# Patient Record
Sex: Female | Born: 1975 | Race: Asian | Hispanic: No | Marital: Married | Smoking: Never smoker
Health system: Southern US, Community
[De-identification: ages and names within clinical notes are randomized; demographics above are authoritative.]

## PROBLEM LIST (undated history)

## (undated) DIAGNOSIS — M419 Scoliosis, unspecified: Secondary | ICD-10-CM

## (undated) DIAGNOSIS — J302 Other seasonal allergic rhinitis: Secondary | ICD-10-CM

## (undated) DIAGNOSIS — T782XXA Anaphylactic shock, unspecified, initial encounter: Secondary | ICD-10-CM

## (undated) HISTORY — PX: NO PAST SURGERIES: SHX2092

---

## 2012-10-29 NOTE — H&P (Signed)
  History of Present Illness The patient is a 37 year old female who presents today for follow up of their neck. The patient is being followed for their neck pain. The patient presents today following MRI.  Subjective Transcription  She returns today for follow up. She has had an opportunity to see Dr. Yevette Edwards and she was pleased with that second opinion. We have also had a chance to review her new MRI. Today's MRI compared to September 27 still shows the ongoing C6-7 hard disc osteophyte causing compression and foraminal stenosis on the right side. There is Modic endplate changes with a new Schmorl's node extending into the C6 vertebral body. There is severe foraminal stenosis with compression of the right C7 nerve root.    Problem List/Past Medical Pain, Cervical (723.1)   Allergies No Known Drug Allergies. 02/23/2012   Social History Tobacco use. never smoker; smoke(d) less than 1/2 pack(s) per day; uses less than half 1/2 can(s) smokeless per week   Medication History BC pill Active. anti-histamine Active. (prn)   Objective Transcription  She is a pleasant woman who appears younger than her stated age. She is alert and oriented times three. No shortness of breath or chest pain. Abdomen is soft, nontender. No history of incontinence of bowel or bladder. Normal gait pattern. Negative Babinski test. 1+ symmetrical deep tendon reflexes. She is still having significant pain into the right scapula and occasionally numbness and dysesthesias into the right C7 distribution. Her right triceps strength is now 4/5 which has progressed from her September visit. No obvious skin lesions, abrasions, contusions to the cervical spine.    Plans Transcription  At this time I am quite concerned because of the ongoing nature of her radiculopathy. There are motor deficits which seem to be subjectively worse than compared to when she first saw me in August. She has had an  opportunity to review all of the treatment options. We have tried physical therapy, injection therapy, medications and yet she continues to have worsening overall clinical symptoms. At this point I think the best course of action is a single level anterior cervical discectomy and fusion. This would allow decompression of the nerve root, removal of the fragment, stabilization of the joint so that the bone spur can reabsorb. The risks include infection, bleeding, nerve damage, death, stroke, paralysis, failure to heal, need for further surgery, ongoing or worse pain, loss of bowel and bladder control, throat pain, swallowing difficulties, hoarseness in the voice, nonunion. All of her questions were addressed. We will be planning on proceeding with a single level ACDF in the near future.

## 2012-11-04 ENCOUNTER — Encounter (HOSPITAL_COMMUNITY): Payer: Self-pay | Admitting: Pharmacy Technician

## 2012-11-06 ENCOUNTER — Encounter (HOSPITAL_COMMUNITY): Payer: Self-pay

## 2012-11-06 ENCOUNTER — Encounter (HOSPITAL_COMMUNITY)
Admission: RE | Admit: 2012-11-06 | Discharge: 2012-11-06 | Disposition: A | Discharge status: Home / Self Care | Payer: BC Managed Care – PPO | Source: Ambulatory Visit | Attending: Orthopedic Surgery | Admitting: Orthopedic Surgery

## 2012-11-06 DIAGNOSIS — Z01812 Encounter for preprocedural laboratory examination: Secondary | ICD-10-CM | POA: Insufficient documentation

## 2012-11-06 DIAGNOSIS — M4120 Other idiopathic scoliosis, site unspecified: Secondary | ICD-10-CM | POA: Insufficient documentation

## 2012-11-06 DIAGNOSIS — M5030 Other cervical disc degeneration, unspecified cervical region: Secondary | ICD-10-CM | POA: Insufficient documentation

## 2012-11-06 DIAGNOSIS — Z01818 Encounter for other preprocedural examination: Secondary | ICD-10-CM | POA: Insufficient documentation

## 2012-11-06 HISTORY — DX: Scoliosis, unspecified: M41.9

## 2012-11-06 HISTORY — DX: Anaphylactic shock, unspecified, initial encounter: T78.2XXA

## 2012-11-06 HISTORY — DX: Other seasonal allergic rhinitis: J30.2

## 2012-11-06 LAB — HCG, SERUM, QUALITATIVE: Preg, Serum: NEGATIVE

## 2012-11-06 LAB — CBC
MCH: 30.4 pg (ref 26.0–34.0)
MCHC: 33.8 g/dL (ref 30.0–36.0)
Platelets: 265 10*3/uL (ref 150–400)
RDW: 13 % (ref 11.5–15.5)

## 2012-11-06 LAB — SURGICAL PCR SCREEN
MRSA, PCR: NEGATIVE
Staphylococcus aureus: POSITIVE — AB

## 2012-11-06 NOTE — Pre-Procedure Instructions (Signed)
Chin-Hsin Utt  11/06/2012   Your procedure is scheduled on:  May 22  Report to [facility] at 05:30 AM.  Call this number if you have problems the morning of surgery: [phone]   Remember:   Do not eat food or drink liquids after midnight.   Take these medicines the morning of surgery with A SIP OF WATER: Albuterol (bring inhaler with you, Zyrtec, Birth Control   STOP Naproxen (Aleve) today  Do not wear jewelry, make-up or nail polish.  Do not wear lotions, powders, or perfumes. You may wear deodorant.  Do not shave 48 hours prior to surgery. Men may shave face and neck.  Do not bring valuables to the hospital.  Contacts, dentures or bridgework may not be worn into surgery.  Leave suitcase in the car. After surgery it may be brought to your room.  For patients admitted to the hospital, checkout time is 11:00 AM the day of discharge.   Patients discharged the day of surgery will not be allowed to drive home.  Name and phone number of your driver: Family  Special Instructions: Shower using CHG 2 nights before surgery and the night before surgery.  If you shower the day of surgery use CHG.  Use special wash - you have one bottle of CHG for all showers.  You should use approximately 1/3 of the bottle for each shower.   Please read over the following fact sheets that you were given: Pain Booklet, Coughing and Deep Breathing and Surgical Site Infection Prevention

## 2012-11-13 MED ORDER — DEXAMETHASONE SODIUM PHOSPHATE 4 MG/ML IJ SOLN
4.0000 mg | Freq: Once | INTRAMUSCULAR | Status: DC
  Filled 2012-11-13: qty 1

## 2012-11-13 MED ORDER — ACETAMINOPHEN 10 MG/ML IV SOLN
1000.0000 mg | Freq: Once | INTRAVENOUS | Status: AC
  Administered 2012-11-14: 1000 mg via INTRAVENOUS
  Filled 2012-11-13: qty 100

## 2012-11-13 MED ORDER — CEFAZOLIN SODIUM-DEXTROSE 2-3 GM-% IV SOLR
2.0000 g | INTRAVENOUS | Status: AC
  Administered 2012-11-14: 2 g via INTRAVENOUS
  Filled 2012-11-13: qty 50

## 2012-11-14 ENCOUNTER — Ambulatory Visit (HOSPITAL_COMMUNITY)
Admission: RE | Admit: 2012-11-14 | Discharge: 2012-11-14 | Disposition: A | Discharge status: Home / Self Care | Payer: BC Managed Care – PPO | Source: Ambulatory Visit | Attending: Orthopedic Surgery | Admitting: Orthopedic Surgery

## 2012-11-14 ENCOUNTER — Encounter (HOSPITAL_COMMUNITY): Payer: Self-pay | Admitting: Anesthesiology

## 2012-11-14 ENCOUNTER — Ambulatory Visit (HOSPITAL_COMMUNITY): Payer: BC Managed Care – PPO | Admitting: Anesthesiology

## 2012-11-14 ENCOUNTER — Encounter (HOSPITAL_COMMUNITY): Payer: Self-pay

## 2012-11-14 ENCOUNTER — Ambulatory Visit (HOSPITAL_COMMUNITY): Payer: BC Managed Care – PPO

## 2012-11-14 ENCOUNTER — Encounter (HOSPITAL_COMMUNITY)
Admission: RE | Disposition: A | Discharge status: Home / Self Care | Payer: Self-pay | Source: Ambulatory Visit | Attending: Orthopedic Surgery

## 2012-11-14 DIAGNOSIS — M47812 Spondylosis without myelopathy or radiculopathy, cervical region: Secondary | ICD-10-CM | POA: Insufficient documentation

## 2012-11-14 DIAGNOSIS — IMO0002 Reserved for concepts with insufficient information to code with codable children: Secondary | ICD-10-CM | POA: Diagnosis not present

## 2012-11-14 DIAGNOSIS — J45909 Unspecified asthma, uncomplicated: Secondary | ICD-10-CM | POA: Insufficient documentation

## 2012-11-14 HISTORY — PX: ANTERIOR CERVICAL DECOMP/DISCECTOMY FUSION: SHX1161

## 2012-11-14 SURGERY — ANTERIOR CERVICAL DECOMPRESSION/DISCECTOMY FUSION 1 LEVEL
Anesthesia: General | Site: Neck | Wound class: Clean

## 2012-11-14 MED ORDER — MORPHINE SULFATE 2 MG/ML IJ SOLN: 1.0000 mg | INTRAMUSCULAR | Status: DC | PRN

## 2012-11-14 MED ORDER — HYDROMORPHONE HCL PF 1 MG/ML IJ SOLN
INTRAMUSCULAR | Status: AC
  Filled 2012-11-14: qty 1

## 2012-11-14 MED ORDER — LACTATED RINGERS IV SOLN: INTRAVENOUS | Status: DC

## 2012-11-14 MED ORDER — HYDROMORPHONE HCL PF 1 MG/ML IJ SOLN
0.2500 mg | INTRAMUSCULAR | Status: DC | PRN
  Administered 2012-11-14 (×2): 0.25 mg via INTRAVENOUS

## 2012-11-14 MED ORDER — BUPIVACAINE-EPINEPHRINE PF 0.25-1:200000 % IJ SOLN
INTRAMUSCULAR | Status: AC
  Filled 2012-11-14: qty 30

## 2012-11-14 MED ORDER — LACTATED RINGERS IV SOLN
INTRAVENOUS | Status: DC | PRN
  Administered 2012-11-14 (×2): via INTRAVENOUS

## 2012-11-14 MED ORDER — METHOCARBAMOL 500 MG PO TABS: 500.0000 mg | ORAL_TABLET | Freq: Three times a day (TID) | ORAL | Status: AC | PRN

## 2012-11-14 MED ORDER — DEXAMETHASONE 4 MG PO TABS
4.0000 mg | ORAL_TABLET | Freq: Four times a day (QID) | ORAL | Status: DC
  Filled 2012-11-14 (×4): qty 1

## 2012-11-14 MED ORDER — DIPHENHYDRAMINE HCL 50 MG/ML IJ SOLN
INTRAMUSCULAR | Status: DC | PRN
  Administered 2012-11-14: 12.5 mg via INTRAVENOUS

## 2012-11-14 MED ORDER — LIDOCAINE HCL 4 % MT SOLN
OROMUCOSAL | Status: DC | PRN
  Administered 2012-11-14: 4 mL via TOPICAL

## 2012-11-14 MED ORDER — OXYCODONE HCL 5 MG PO TABS: 5.0000 mg | ORAL_TABLET | Freq: Once | ORAL | Status: DC | PRN

## 2012-11-14 MED ORDER — ROCURONIUM BROMIDE 100 MG/10ML IV SOLN
INTRAVENOUS | Status: DC | PRN
  Administered 2012-11-14: 40 mg via INTRAVENOUS

## 2012-11-14 MED ORDER — GLYCOPYRROLATE 0.2 MG/ML IJ SOLN
INTRAMUSCULAR | Status: DC | PRN
  Administered 2012-11-14: 0.6 mg via INTRAVENOUS

## 2012-11-14 MED ORDER — ACETAMINOPHEN 10 MG/ML IV SOLN
1000.0000 mg | Freq: Four times a day (QID) | INTRAVENOUS | Status: DC
  Administered 2012-11-14: 1000 mg via INTRAVENOUS
  Filled 2012-11-14 (×4): qty 100

## 2012-11-14 MED ORDER — ONDANSETRON HCL 4 MG/2ML IJ SOLN: 4.0000 mg | INTRAMUSCULAR | Status: DC | PRN

## 2012-11-14 MED ORDER — 0.9 % SODIUM CHLORIDE (POUR BTL) OPTIME
TOPICAL | Status: DC | PRN
  Administered 2012-11-14: 1000 mL

## 2012-11-14 MED ORDER — OXYCODONE HCL 5 MG PO TABS
10.0000 mg | ORAL_TABLET | ORAL | Status: DC | PRN
  Administered 2012-11-14: 10 mg via ORAL
  Filled 2012-11-14: qty 2

## 2012-11-14 MED ORDER — ONDANSETRON HCL 4 MG/2ML IJ SOLN
INTRAMUSCULAR | Status: DC | PRN
  Administered 2012-11-14: 4 mg via INTRAVENOUS

## 2012-11-14 MED ORDER — CEFAZOLIN SODIUM 1-5 GM-% IV SOLN
1.0000 g | Freq: Three times a day (TID) | INTRAVENOUS | Status: DC
  Administered 2012-11-14: 1 g via INTRAVENOUS
  Filled 2012-11-14 (×2): qty 50

## 2012-11-14 MED ORDER — PROPOFOL 10 MG/ML IV BOLUS
INTRAVENOUS | Status: DC | PRN
  Administered 2012-11-14: 180 mg via INTRAVENOUS

## 2012-11-14 MED ORDER — MIDAZOLAM HCL 5 MG/5ML IJ SOLN
INTRAMUSCULAR | Status: DC | PRN
  Administered 2012-11-14: 1 mg via INTRAVENOUS

## 2012-11-14 MED ORDER — THROMBIN 20000 UNITS EX SOLR
CUTANEOUS | Status: AC
  Filled 2012-11-14: qty 20000

## 2012-11-14 MED ORDER — ONDANSETRON HCL 4 MG PO TABS: 4.0000 mg | ORAL_TABLET | Freq: Three times a day (TID) | ORAL | Status: AC | PRN

## 2012-11-14 MED ORDER — PHENOL 1.4 % MT LIQD: 1.0000 | OROMUCOSAL | Status: DC | PRN

## 2012-11-14 MED ORDER — METHOCARBAMOL 100 MG/ML IJ SOLN
500.0000 mg | Freq: Four times a day (QID) | INTRAVENOUS | Status: DC | PRN
  Filled 2012-11-14: qty 5

## 2012-11-14 MED ORDER — DEXAMETHASONE SODIUM PHOSPHATE 10 MG/ML IJ SOLN
INTRAMUSCULAR | Status: AC
  Administered 2012-11-14: 10 mg via INTRAVENOUS
  Filled 2012-11-14: qty 1

## 2012-11-14 MED ORDER — LIDOCAINE HCL (CARDIAC) 20 MG/ML IV SOLN
INTRAVENOUS | Status: DC | PRN
  Administered 2012-11-14: 80 mg via INTRAVENOUS

## 2012-11-14 MED ORDER — SODIUM CHLORIDE 0.9 % IJ SOLN: 3.0000 mL | INTRAMUSCULAR | Status: DC | PRN

## 2012-11-14 MED ORDER — THROMBIN 20000 UNITS EX SOLR: CUTANEOUS | Status: DC | PRN

## 2012-11-14 MED ORDER — OXYCODONE-ACETAMINOPHEN 10-325 MG PO TABS: 1.0000 | ORAL_TABLET | ORAL | Status: AC | PRN

## 2012-11-14 MED ORDER — OXYCODONE HCL 5 MG/5ML PO SOLN: 5.0000 mg | Freq: Once | ORAL | Status: DC | PRN

## 2012-11-14 MED ORDER — ZOLPIDEM TARTRATE 5 MG PO TABS: 5.0000 mg | ORAL_TABLET | Freq: Every evening | ORAL | Status: DC | PRN

## 2012-11-14 MED ORDER — MENTHOL 3 MG MT LOZG: 1.0000 | LOZENGE | OROMUCOSAL | Status: DC | PRN

## 2012-11-14 MED ORDER — BUPIVACAINE-EPINEPHRINE 0.25% -1:200000 IJ SOLN
INTRAMUSCULAR | Status: DC | PRN
  Administered 2012-11-14: 6 mL

## 2012-11-14 MED ORDER — NEOSTIGMINE METHYLSULFATE 1 MG/ML IJ SOLN
INTRAMUSCULAR | Status: DC | PRN
  Administered 2012-11-14: 3 mg via INTRAVENOUS

## 2012-11-14 MED ORDER — DOCUSATE SODIUM 100 MG PO CAPS: 100.0000 mg | ORAL_CAPSULE | Freq: Three times a day (TID) | ORAL | Status: AC | PRN

## 2012-11-14 MED ORDER — ACETAMINOPHEN 10 MG/ML IV SOLN
INTRAVENOUS | Status: AC
  Filled 2012-11-14: qty 100

## 2012-11-14 MED ORDER — FENTANYL CITRATE 0.05 MG/ML IJ SOLN
INTRAMUSCULAR | Status: DC | PRN
  Administered 2012-11-14 (×3): 50 ug via INTRAVENOUS

## 2012-11-14 MED ORDER — METHOCARBAMOL 500 MG PO TABS
500.0000 mg | ORAL_TABLET | Freq: Four times a day (QID) | ORAL | Status: DC | PRN
  Filled 2012-11-14: qty 1

## 2012-11-14 MED ORDER — POLYETHYLENE GLYCOL 3350 17 GM/SCOOP PO POWD: 17.0000 g | Freq: Every day | ORAL | Status: AC

## 2012-11-14 MED ORDER — DEXAMETHASONE SODIUM PHOSPHATE 4 MG/ML IJ SOLN
4.0000 mg | Freq: Four times a day (QID) | INTRAMUSCULAR | Status: DC
Start: 2012-11-14 — End: 2012-11-14
  Filled 2012-11-14 (×4): qty 1

## 2012-11-14 MED ORDER — SODIUM CHLORIDE 0.9 % IV SOLN: 250.0000 mL | INTRAVENOUS | Status: DC

## 2012-11-14 MED ORDER — ALBUTEROL SULFATE HFA 108 (90 BASE) MCG/ACT IN AERS: 2.0000 | INHALATION_SPRAY | RESPIRATORY_TRACT | Status: DC | PRN

## 2012-11-14 MED ORDER — THROMBIN 20000 UNITS EX SOLR
OROMUCOSAL | Status: DC | PRN
  Administered 2012-11-14: 08:00:00 via TOPICAL

## 2012-11-14 MED ORDER — SODIUM CHLORIDE 0.9 % IJ SOLN: 3.0000 mL | Freq: Two times a day (BID) | INTRAMUSCULAR | Status: DC

## 2012-11-14 MED ORDER — PHENYLEPHRINE HCL 10 MG/ML IJ SOLN
INTRAMUSCULAR | Status: DC | PRN
  Administered 2012-11-14 (×10): 40 ug via INTRAVENOUS

## 2012-11-14 MED ORDER — EPHEDRINE SULFATE 50 MG/ML IJ SOLN
INTRAMUSCULAR | Status: DC | PRN
  Administered 2012-11-14: 5 mg via INTRAVENOUS

## 2012-11-14 MED ORDER — EPINEPHRINE 0.3 MG/0.3ML IJ SOAJ: 0.3000 mg | INTRAMUSCULAR | Status: DC | PRN

## 2012-11-14 SURGICAL SUPPLY — 56 items
BIT DRILL SKYLINE 12MM (BIT) ×1 IMPLANT
BLADE SURG ROTATE 9660 (MISCELLANEOUS) IMPLANT
BUR EGG ELITE 4.0 (BURR) IMPLANT
BUR MATCHSTICK NEURO 3.0 LAGG (BURR) IMPLANT
CANISTER SUCTION 2500CC (MISCELLANEOUS) ×2 IMPLANT
CLOTH BEACON ORANGE TIMEOUT ST (SAFETY) ×2 IMPLANT
CLSR STERI-STRIP ANTIMIC 1/2X4 (GAUZE/BANDAGES/DRESSINGS) ×2 IMPLANT
CORDS BIPOLAR (ELECTRODE) ×2 IMPLANT
COVER SURGICAL LIGHT HANDLE (MISCELLANEOUS) ×4 IMPLANT
CRADLE DONUT ADULT HEAD (MISCELLANEOUS) ×2 IMPLANT
DRAPE C-ARM 42X72 X-RAY (DRAPES) ×2 IMPLANT
DRAPE POUCH INSTRU U-SHP 10X18 (DRAPES) ×2 IMPLANT
DRAPE SURG 17X23 STRL (DRAPES) ×2 IMPLANT
DRAPE U-SHAPE 47X51 STRL (DRAPES) ×2 IMPLANT
DRILL BIT SKYLINE 12MM (BIT) ×1
DRSG MEPILEX BORDER 4X4 (GAUZE/BANDAGES/DRESSINGS) ×2 IMPLANT
DURAPREP 26ML APPLICATOR (WOUND CARE) ×2 IMPLANT
ELECT COATED BLADE 2.86 ST (ELECTRODE) ×2 IMPLANT
ELECT REM PT RETURN 9FT ADLT (ELECTROSURGICAL) ×2
ELECTRODE REM PT RTRN 9FT ADLT (ELECTROSURGICAL) ×1 IMPLANT
GLOVE BIOGEL PI IND STRL 8.5 (GLOVE) ×1 IMPLANT
GLOVE BIOGEL PI INDICATOR 8.5 (GLOVE) ×1
GLOVE ECLIPSE 8.5 STRL (GLOVE) ×2 IMPLANT
GOWN PREVENTION PLUS XXLARGE (GOWN DISPOSABLE) ×2 IMPLANT
GOWN STRL REIN XL XLG (GOWN DISPOSABLE) ×4 IMPLANT
INTERLOCK LRDTC CRVCL VBR 7MM (Bone Implant) ×1 IMPLANT
KIT BASIN OR (CUSTOM PROCEDURE TRAY) ×2 IMPLANT
KIT ROOM TURNOVER OR (KITS) ×2 IMPLANT
LORDOTIC CERVICAL VBR 7MM SM (Bone Implant) ×2 IMPLANT
NEEDLE SPNL 18GX3.5 QUINCKE PK (NEEDLE) ×2 IMPLANT
NS IRRIG 1000ML POUR BTL (IV SOLUTION) ×2 IMPLANT
PACK ORTHO CERVICAL (CUSTOM PROCEDURE TRAY) ×2 IMPLANT
PACK UNIVERSAL I (CUSTOM PROCEDURE TRAY) ×2 IMPLANT
PAD ARMBOARD 7.5X6 YLW CONV (MISCELLANEOUS) ×4 IMPLANT
PATTIES SURGICAL .25X.25 (GAUZE/BANDAGES/DRESSINGS) IMPLANT
PIN DISTRACTION 14 (PIN) ×4 IMPLANT
PLATE SKYLINE 12MM (Plate) ×2 IMPLANT
PUTTY BONE DBX 2.5 MIS (Bone Implant) ×2 IMPLANT
RESTRAINT LIMB HOLDER UNIV (RESTRAINTS) ×2 IMPLANT
SCREW SELF DRILL SKYLINE 12MM (Screw) ×4 IMPLANT
SCREW SKYLINE 14MM SD-VA (Screw) ×4 IMPLANT
SCREW SKYLINE VAR OS 14MM (Screw) ×2 IMPLANT
SPONGE INTESTINAL PEANUT (DISPOSABLE) ×2 IMPLANT
SPONGE SURGIFOAM ABS GEL 100 (HEMOSTASIS) ×2 IMPLANT
SURGIFLO TRUKIT (HEMOSTASIS) IMPLANT
SUT MNCRL AB 3-0 PS2 18 (SUTURE) ×2 IMPLANT
SUT SILK 2 0 (SUTURE)
SUT SILK 2-0 18XBRD TIE 12 (SUTURE) IMPLANT
SUT VIC AB 2-0 CT1 18 (SUTURE) ×2 IMPLANT
SYR BULB IRRIGATION 50ML (SYRINGE) ×2 IMPLANT
SYR CONTROL 10ML LL (SYRINGE) ×2 IMPLANT
TAPE CLOTH 4X10 WHT NS (GAUZE/BANDAGES/DRESSINGS) ×2 IMPLANT
TAPE UMBILICAL COTTON 1/8X30 (MISCELLANEOUS) ×2 IMPLANT
TOWEL OR 17X24 6PK STRL BLUE (TOWEL DISPOSABLE) ×2 IMPLANT
TOWEL OR 17X26 10 PK STRL BLUE (TOWEL DISPOSABLE) ×2 IMPLANT
WATER STERILE IRR 1000ML POUR (IV SOLUTION) ×2 IMPLANT

## 2012-11-14 NOTE — Brief Op Note (Signed)
11/14/2012  9:49 AM  PATIENT:  Chin-Hsin Rumple  37 y.o. 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  PROCEDURE:  Procedure(s): ANTERIOR CERVICAL DISCECTOMY FUSION C6 - C7 1 LEVEL (N/A)  SURGEON:  Surgeon(s) and Role:    * Venita Lick, MD - Primary  PHYSICIAN ASSISTANT:   ASSISTANTS: none   ANESTHESIA:   general  EBL:  Total I/O In: 1000 [I.V.:1000] Out: -   BLOOD ADMINISTERED:none  DRAINS: none   LOCAL MEDICATIONS USED:  MARCAINE     SPECIMEN:  No Specimen  DISPOSITION OF SPECIMEN:  N/A  COUNTS:  YES  TOURNIQUET:  * No tourniquets in log *  DICTATION: .Other Dictation: Dictation Number [id number]  PLAN OF CARE: Admit for overnight observation  PATIENT DISPOSITION:  PACU - hemodynamically stable.

## 2012-11-14 NOTE — H&P (Signed)
No change to H+P Clinical exam reviewed

## 2012-11-14 NOTE — Progress Notes (Signed)
Report to Silvestre Gunner RN as primary caregiver

## 2012-11-14 NOTE — Transfer of Care (Signed)
Immediate Anesthesia Transfer of Care Note  Patient: [name]  Procedure(s) Performed: Procedure(s): ANTERIOR CERVICAL DISCECTOMY FUSION C6 - C7 1 LEVEL (N/A)  Patient Location: PACU  Anesthesia Type:General  Level of Consciousness: awake, oriented and patient cooperative  Airway & Oxygen Therapy: Patient Spontanous Breathing and Patient connected to nasal cannula oxygen  Post-op Assessment: Report given to PACU RN and Post -op Vital signs reviewed and stable  Post vital signs: Reviewed and stable  Complications: No apparent anesthesia complications

## 2012-11-14 NOTE — Anesthesia Procedure Notes (Signed)
Procedure Name: Intubation Date/Time: 11/14/2012 7:38 AM Performed by: Lovie Chol Pre-anesthesia Checklist: Patient identified, Emergency Drugs available, Suction available, Patient being monitored and Timeout performed Patient Re-evaluated:Patient Re-evaluated prior to inductionOxygen Delivery Method: Circle system utilized Preoxygenation: Pre-oxygenation with 100% oxygen Intubation Type: IV induction Ventilation: Mask ventilation without difficulty Laryngoscope Size: Miller and 2 Grade View: Grade I Tube type: Oral Tube size: 7.0 mm Number of attempts: 1 Airway Equipment and Method: Stylet and LTA kit utilized Placement Confirmation: ETT inserted through vocal cords under direct vision,  positive ETCO2,  CO2 detector and breath sounds checked- equal and bilateral Secured at: 20 cm Tube secured with: Tape Dental Injury: Teeth and Oropharynx as per pre-operative assessment

## 2012-11-14 NOTE — Anesthesia Postprocedure Evaluation (Signed)
  Anesthesia Post-op Note  Patient: [name]  Procedure(s) Performed: Procedure(s): ANTERIOR CERVICAL DISCECTOMY FUSION C6 - C7 1 LEVEL (N/A)  Patient Location: PACU  Anesthesia Type:General  Level of Consciousness: awake  Airway and Oxygen Therapy: Patient Spontanous Breathing  Post-op Pain: mild  Post-op Assessment: Post-op Vital signs reviewed, Patient's Cardiovascular Status Stable, Respiratory Function Stable, Patent Airway, No signs of Nausea or vomiting and Pain level controlled  Post-op Vital Signs: stable  Complications: No apparent anesthesia complications

## 2012-11-14 NOTE — Preoperative (Signed)
Beta Blockers   Reason not to administer Beta Blockers:Not Applicable 

## 2012-11-14 NOTE — Progress Notes (Signed)
Patient discharged in stable condition via wheelchair to home. Discharge instructions and prescriptions were given and explained.

## 2012-11-14 NOTE — Anesthesia Preprocedure Evaluation (Signed)
Anesthesia Evaluation  Patient identified by MRN, date of birth, ID band Patient awake    Reviewed: Allergy & Precautions, H&P , NPO status , Patient's Chart, lab work & pertinent test results  Airway Mallampati: I TM Distance: >3 FB Neck ROM: Full    Dental   Pulmonary asthma ,  breath sounds clear to auscultation        Cardiovascular Rhythm:Regular Rate:Normal     Neuro/Psych    GI/Hepatic   Endo/Other    Renal/GU      Musculoskeletal   Abdominal   Peds  Hematology   Anesthesia Other Findings   Reproductive/Obstetrics                           Anesthesia Physical Anesthesia Plan  ASA: II  Anesthesia Plan: General   Post-op Pain Management:    Induction: Intravenous  Airway Management Planned: Oral ETT  Additional Equipment:   Intra-op Plan:   Post-operative Plan: Extubation in OR  Informed Consent: I have reviewed the patients History and Physical, chart, labs and discussed the procedure including the risks, benefits and alternatives for the proposed anesthesia with the patient or authorized representative who has indicated his/her understanding and acceptance.     Plan Discussed with: CRNA and Surgeon  Anesthesia Plan Comments:         Anesthesia Quick Evaluation

## 2012-11-15 ENCOUNTER — Encounter (HOSPITAL_COMMUNITY): Payer: Self-pay | Admitting: Orthopedic Surgery

## 2012-11-15 NOTE — Op Note (Signed)
[name], [name]               ACCOUNT NO.:  [id number]  MEDICAL RECORD NO.:  [id number]  LOCATION:  [id number]                        FACILITY:  [facility]  PHYSICIAN:  Alvy Beal, MD    DATE OF BIRTH:  1976-04-29  DATE OF PROCEDURE:  11/14/2012 DATE OF DISCHARGE:  11/14/2012                              OPERATIVE REPORT   PREOPERATIVE DIAGNOSIS:  Cervical spondylotic radiculopathy, C6-7.  POSTOPERATIVE DIAGNOSIS:  Cervical spondylotic radiculopathy, C6-7.  OPERATIVE PROCEDURE:  Anterior cervical diskectomy and fusion C6-7.  COMPLICATIONS:  None.  CONDITION:  Stable.  Instrumentation system used was a Titan size 7 small intervertebral cage packed with DBX mix with a size 12 anterior cervical DePuy guide line plate.  HISTORY:  This is a very pleasant young woman who has been under my care for some time now.  She has been having progressive debilitating neck and intermittent right radicular arm pain.  Because of the failure to improve with conservative management and the ongoing issues, she elected to proceed with surgery.  All appropriate risks, benefits, and alternatives were discussed and consent was obtained.  OPERATIVE NOTE:  The patient was brought to the operating room, placed supine on the operating table.  After successful induction of general anesthesia and endotracheal intubation, TED, SCDs were applied.  Towels placed between the shoulder blades.  The arms were secured down and the anterior cervical spine was prepped and draped in standard orthopedic fashion.  Time-out was then taken to confirm the patient, procedure, and all other pertinent important data.  Once this was completed, an x-ray was brought into the field to identify the C6-7 level.  Once this was done, I then made a transverse left-sided incision starting at the midline over the C6-7 disk space.  Sharp dissection was carried out down to and through the platysma.  I then bluntly dissected through  the remaining deep cervical and prevertebral fascia sweeping the trachea and esophagus to the right and identifying protected the carotid sheath with my finger on the left.  I then placed a marker into the C6-7 disk space and took an x-ray and confirmed that I was at the appropriate level. Once this was confirmed, I then mobilized the longus coli muscles using bipolar electrocautery from the midbody of C6 to the midbody of C7.  I then placed the self-retaining retractor blades underneath the longus coli muscle and retracted the soft tissue.  An annulotomy was then performed with a 15 blade scalpel then using a combination of pituitary rongeurs, and curettes, I removed the bulk of the C6-7 disk.  I then placed distraction pins into the bodies of C6 and C7.  Gently distracted the space and then continued to work posteriorly. I identified the annulus and resected this.  I then used a small nerve hook to develop a plane underneath the posterior longitudinal ligament. Once I had created a plane between the posterior longitudinal ligament and the thecal sac, I used my 1-mm Kerrison to resect the posterior longitudinal ligament.  I could now freely sweep my nerve hook underneath the posterior vertebral body of C6 and C7, and underneath the uncovertebral joints.  I had an adequate decompression.  I  then rasped the endplates.  I then trialed sequential devices with fluoro guidance and elected to use a 7 small spacer.  This was packed with DBX mix and malleted to the appropriate depth.  The distraction pins were removed. The plate was then applied to the anterior cervical spine and secured with self-drilling screws, size 12 mm into the body of C 7 and 114 regular and 114 rescue into the body of C6. All screws had excellent purchase.  There were then torqued and locked according to manufacturer's standards.  I  then removed the self- retaining retractors, irrigated copiously with normal saline and  then checked the esophagus to make sure it was not entrapped beneath the plate.  I then returned the trachea and esophagus to midline after obtaining hemostasis using bipolar electrocautery.  I then closed the platysma with interrupted 2-0 Vicryl sutures and a 3-0 Monocryl for the skin.  Steri-Strips and dry dressing and a collar were applied.  The patient was extubated, transferred to PACU without incident.  At the end of the case, all needle and sponge counts were correct.     Alvy Beal, MD     DDB/MEDQ  D:  11/14/2012  T:  11/15/2012  Job:  [id number]

## 2012-11-19 NOTE — Discharge Summary (Signed)
Patient ID: [name] MRN: [id number] DOB/AGE: August 27, 1975 37 y.o.  Admit date: 11/14/2012 Discharge date: 11/19/2012  Admission Diagnoses:  Active Problems:   * No active hospital problems. *   Discharge Diagnoses:  Active Problems:   * No active hospital problems. *  status post Procedure(s): ANTERIOR CERVICAL DISCECTOMY FUSION C6 - C7 1 LEVEL  Past Medical History  Diagnosis Date  . Idiopathic anaphylaxis     Will start in left eyelid swelling, followed by mucous production and bump in left web area of thumb. Has not needed epi pen thus far  . Scoliosis   . Seasonal allergies     Surgeries: Procedure(s): ANTERIOR CERVICAL DISCECTOMY FUSION C6 - C7 1 LEVEL on 11/14/2012   Consultants:  none  Discharged Condition: Improved  Hospital Course: [name] is an 37 y.o. female who was admitted 11/14/2012 for operative treatment of <principal problem not specified>. Patient failed conservative treatments (please see the history and physical for the specifics) and had severe unremitting pain that affects sleep, daily activities and work/hobbies. After pre-op clearance, the patient was taken to the operating room on 11/14/2012 and underwent  Procedure(s): ANTERIOR CERVICAL DISCECTOMY FUSION C6 - C7 1 LEVEL.    Patient was given perioperative antibiotics:  Anti-infectives   Start     Dose/Rate Route Frequency Ordered Stop   11/14/12 1400  ceFAZolin (ANCEF) IVPB 1 g/50 mL premix  Status:  Discontinued     1 g 100 mL/hr over 30 Minutes Intravenous Every 8 hours 11/14/12 1326 11/14/12 2044   11/13/12 1412  ceFAZolin (ANCEF) IVPB 2 g/50 mL premix     2 g 100 mL/hr over 30 Minutes Intravenous 30 min pre-op 11/13/12 1412 11/14/12 0730       Patient was given sequential compression devices and early ambulation to prevent DVT.   Patient benefited maximally from hospital stay and there were no complications. At the time of discharge, the patient was urinating/moving their  bowels without difficulty, tolerating a regular diet, pain is controlled with oral pain medications and they have been cleared by PT/OT.   Recent vital signs: No data found.    Recent laboratory studies: No results found for this basename: WBC, HGB, HCT, PLT, NA, K, CL, CO2, BUN, CREATININE, GLUCOSE, PT, INR, CALCIUM, 2,  in the last 72 hours   Discharge Medications:     Medication List    STOP taking these medications       acyclovir 800 MG tablet  Commonly known as:  ZOVIRAX     naproxen sodium 220 MG tablet  Commonly known as:  ANAPROX      TAKE these medications       albuterol 108 (90 BASE) MCG/ACT inhaler  Commonly known as:  PROVENTIL HFA;VENTOLIN HFA  Inhale 2 puffs into the lungs every 4 (four) hours as needed for wheezing or shortness of breath.     cetirizine 10 MG tablet  Commonly known as:  ZYRTEC  Take 10 mg by mouth daily.     docusate sodium 100 MG capsule  Commonly known as:  COLACE  Take 1 capsule (100 mg total) by mouth 3 (three) times daily as needed for constipation.     EPIPEN 0.3 mg/0.3 mL Devi  Generic drug:  EPINEPHrine  Inject 0.3 mg into the muscle as needed (for allergic reaction).     Lactase 9000 UNITS Tabs  Take 1 tablet by mouth as needed.     methocarbamol 500 MG tablet  Commonly  known as:  ROBAXIN  Take 1 tablet (500 mg total) by mouth 3 (three) times daily as needed.     MICROGESTIN FE 1/20 1-20 MG-MCG tablet  Generic drug:  norethindrone-ethinyl estradiol  Take 1 tablet by mouth daily.     ondansetron 4 MG tablet  Commonly known as:  ZOFRAN  Take 1 tablet (4 mg total) by mouth every 8 (eight) hours as needed for nausea.     oxyCODONE-acetaminophen 10-325 MG per tablet  Commonly known as:  PERCOCET  Take 1 tablet by mouth every 4 (four) hours as needed for pain.     polyethylene glycol powder powder  Commonly known as:  GLYCOLAX  Take 17 g by mouth daily.        Diagnostic Studies: Dg Cervical Spine 2 Or 3  Views  11/14/2012   *RADIOLOGY REPORT*  Clinical Data: Postop cervical fusion  CERVICAL SPINE - 2-3 VIEW  Comparison: Intraoperative fluoroscopic views 11/15/2002 and cervical spine radiographs 11/06/2012  Findings: There are postoperative changes of anterior c cervical discectomy and fusion spanning C6-C7.  Cervical spine is imaged through the superior endplate of T1. The vertebral bodies are normal in height and alignment.  No hardware complication is identified.  Probable subcutaneous gas in the left neck on the frontal view.  On the frontal view, convex right scoliosis of the upper thoracic spine is noted.  IMPRESSION: Recent postoperative changes of the anterior cervical discectomy and fusion at C6-C7.  No acomplicating features.   Original Report Authenticated By: Britta Mccreedy, M.D.   Dg Cervical Spine 2-3 Views  11/14/2012   *RADIOLOGY REPORT*  Clinical Data: Cervical fusion  DG C-ARM 1-60 MIN,CERVICAL SPINE - 2-3 VIEW  Comparison: 11/06/2012  Findings: Two intraoperative fluoroscopic spot images document changes of instrumented ACDF C6-7.  IMPRESSION:  ACDF C6-7   Original Report Authenticated By: D. Andria Rhein, MD   Dg Cervical Spine 2 Or 3 Views  11/06/2012   *RADIOLOGY REPORT*  Clinical Data: Cervical disc disease.  CERVICAL SPINE - 2-3 VIEW  Comparison: None.  Findings: The patient has an upper thoracic scoliosis with slight curvature involving the lower cervical spine.  There is degenerative disc disease at C6-7 with slight disc space narrowing and osteophyte formation and endplate sclerosis.  The remainder the cervical spine is normal.  There is slight reversal of the cervical lordosis.  IMPRESSION: Degenerative disc disease at C6-7.  Cervicothoracic scoliosis.   Original Report Authenticated By: Francene Boyers, M.D.   Dg C-arm 1-60 Min  11/14/2012   *RADIOLOGY REPORT*  Clinical Data: Cervical fusion  DG C-ARM 1-60 MIN,CERVICAL SPINE - 2-3 VIEW  Comparison: 11/06/2012  Findings: Two  intraoperative fluoroscopic spot images document changes of instrumented ACDF C6-7.  IMPRESSION:  ACDF C6-7   Original Report Authenticated By: D. Andria Rhein, MD          Follow-up Information   Follow up with Alvy Beal, MD. Call in 2 weeks.   Contact information:   [address], [address] [place] [address] [phone]       Discharge Plan:  discharge to home  Disposition: stable    Signed: Venita Lick D for Dr. Venita Lick [facility] [phone] 11/19/2012, 9:20 AM

## 2012-12-30 ENCOUNTER — Ambulatory Visit: Payer: BC Managed Care – PPO | Attending: Orthopedic Surgery | Admitting: Physical Therapy

## 2012-12-30 DIAGNOSIS — IMO0001 Reserved for inherently not codable concepts without codable children: Secondary | ICD-10-CM | POA: Insufficient documentation

## 2012-12-30 DIAGNOSIS — M25519 Pain in unspecified shoulder: Secondary | ICD-10-CM | POA: Insufficient documentation

## 2012-12-30 DIAGNOSIS — R293 Abnormal posture: Secondary | ICD-10-CM | POA: Insufficient documentation

## 2012-12-30 DIAGNOSIS — M542 Cervicalgia: Secondary | ICD-10-CM | POA: Insufficient documentation

## 2013-01-02 ENCOUNTER — Ambulatory Visit: Payer: BC Managed Care – PPO | Admitting: Physical Therapy

## 2013-01-06 ENCOUNTER — Ambulatory Visit: Payer: BC Managed Care – PPO | Admitting: Physical Therapy

## 2013-01-08 ENCOUNTER — Ambulatory Visit: Payer: BC Managed Care – PPO | Admitting: Physical Therapy

## 2013-01-09 ENCOUNTER — Encounter: Payer: BC Managed Care – PPO | Admitting: Physical Therapy

## 2013-01-20 ENCOUNTER — Ambulatory Visit: Payer: BC Managed Care – PPO | Admitting: Physical Therapy

## 2013-01-23 ENCOUNTER — Ambulatory Visit: Payer: BC Managed Care – PPO | Admitting: Physical Therapy

## 2013-02-12 ENCOUNTER — Ambulatory Visit: Payer: PRIVATE HEALTH INSURANCE | Admitting: Physical Therapy

## 2013-09-09 ENCOUNTER — Other Ambulatory Visit: Payer: Self-pay | Admitting: Orthopedic Surgery

## 2013-09-09 DIAGNOSIS — Z981 Arthrodesis status: Secondary | ICD-10-CM

## 2013-09-12 ENCOUNTER — Ambulatory Visit
Admission: RE | Admit: 2013-09-12 | Discharge: 2013-09-12 | Disposition: A | Discharge status: Home / Self Care | Payer: 59 | Source: Ambulatory Visit | Attending: Orthopedic Surgery | Admitting: Orthopedic Surgery

## 2013-09-12 DIAGNOSIS — Z981 Arthrodesis status: Secondary | ICD-10-CM

## 2014-01-06 ENCOUNTER — Other Ambulatory Visit: Payer: Self-pay | Admitting: Family Medicine

## 2014-01-06 ENCOUNTER — Other Ambulatory Visit (HOSPITAL_COMMUNITY)
Admission: RE | Admit: 2014-01-06 | Discharge: 2014-01-06 | Disposition: A | Discharge status: Home / Self Care | Payer: BC Managed Care – PPO | Source: Ambulatory Visit | Attending: Family Medicine | Admitting: Family Medicine

## 2014-01-06 DIAGNOSIS — Z124 Encounter for screening for malignant neoplasm of cervix: Secondary | ICD-10-CM | POA: Insufficient documentation

## 2014-01-06 DIAGNOSIS — Z1151 Encounter for screening for human papillomavirus (HPV): Secondary | ICD-10-CM | POA: Insufficient documentation

## 2014-01-07 LAB — CYTOLOGY - PAP

## 2014-04-17 IMAGING — CR DG CERVICAL SPINE 2 OR 3 VIEWS
2 series · 2 of 2 positions shown · non-contrast
Comparison: Intraoperative fluoroscopic views 11/15/2002 and
cervical spine radiographs 11/06/2012

CLINICAL DATA: Postop cervical fusion

CERVICAL SPINE - 2-3 VIEW

[AP]
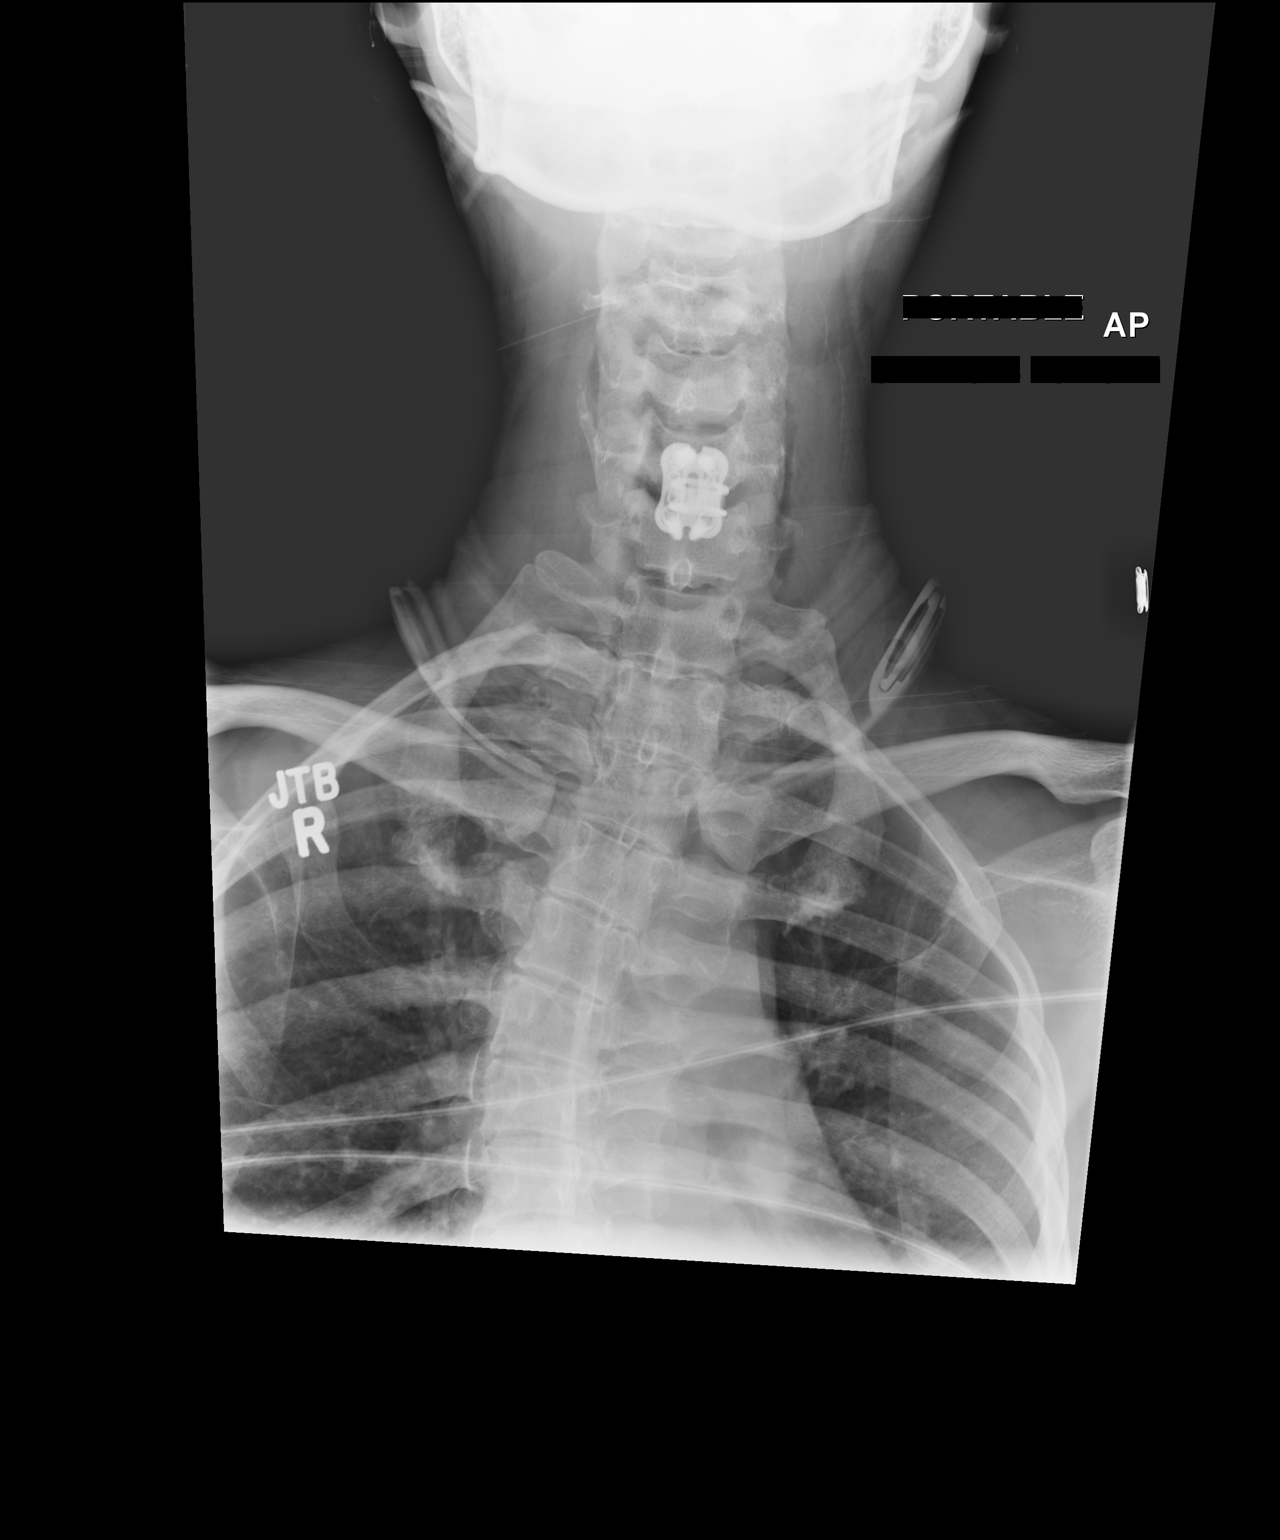

[xtable]
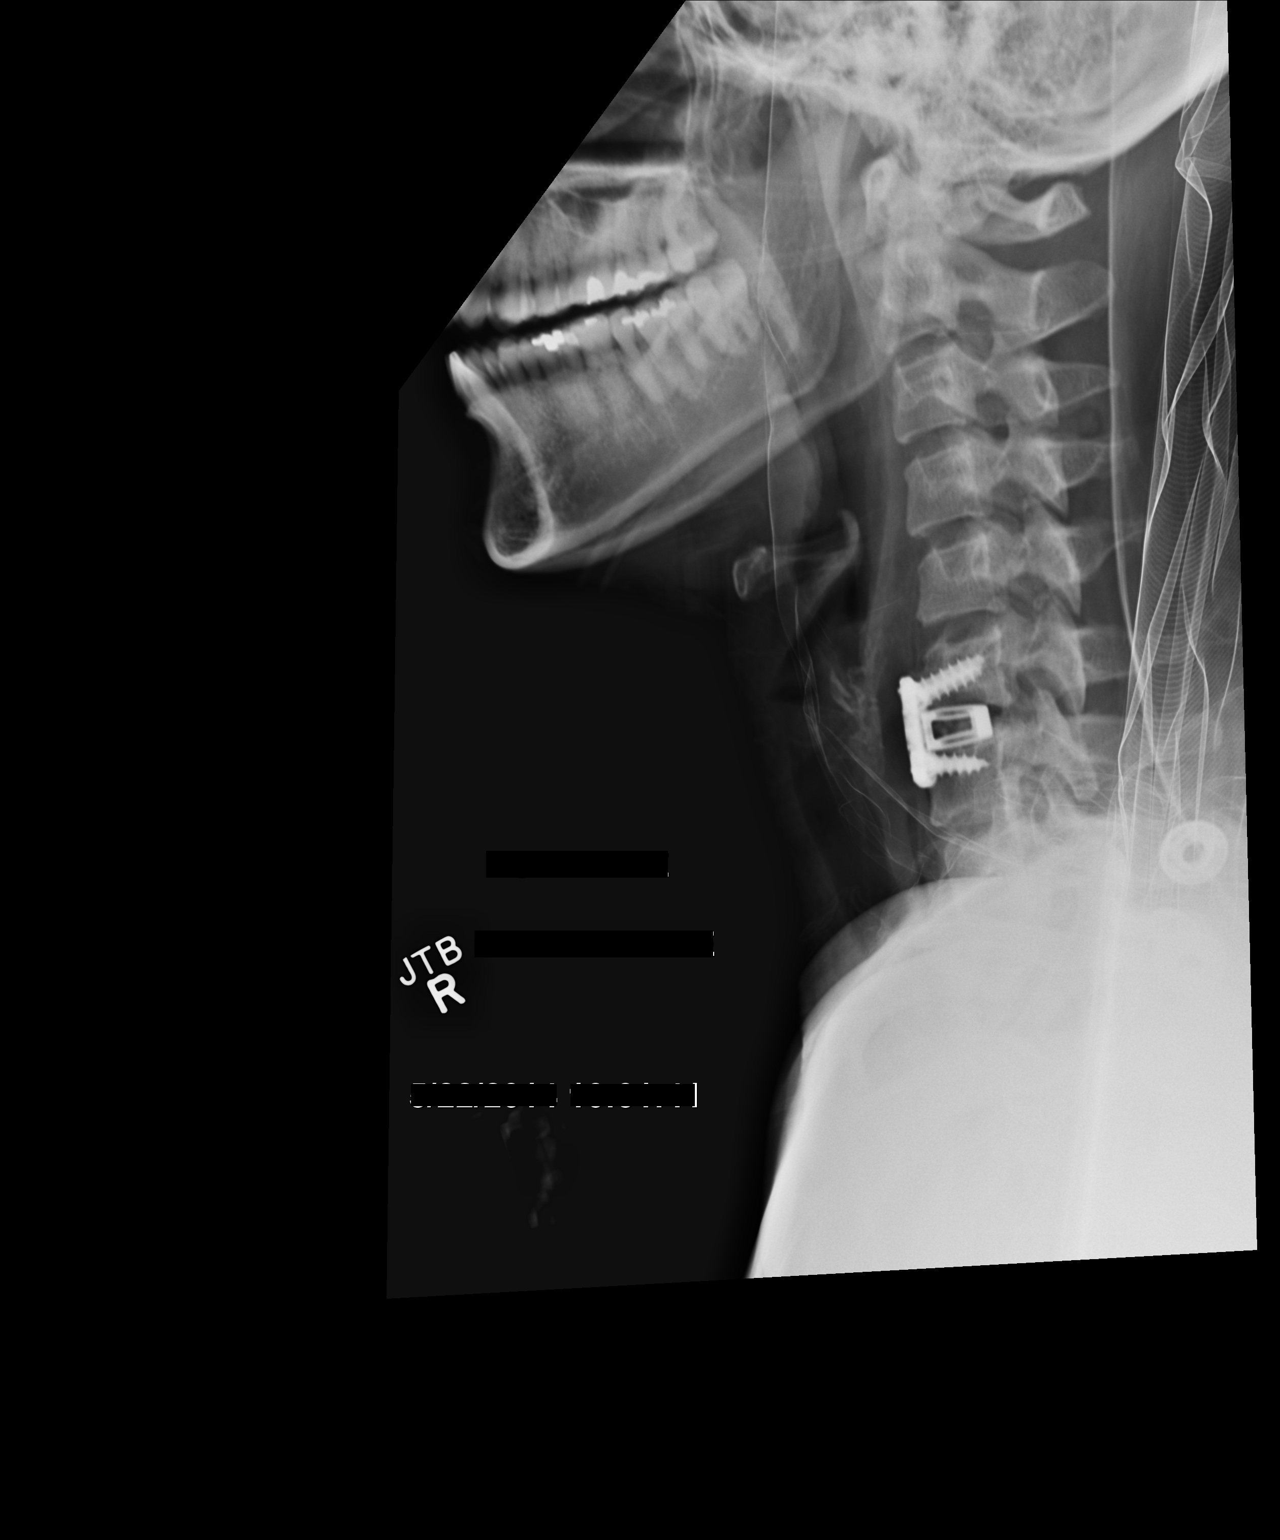

[2 of 2 positions shown; findings below may reference images not displayed]

FINDINGS: There are postoperative changes of anterior c cervical
discectomy and fusion spanning C6-C7.  Cervical spine is imaged
through the superior endplate of T1. The vertebral bodies are
normal in height and alignment.  No hardware complication is
identified.

Probable subcutaneous gas in the left neck on the frontal view.

On the frontal view, convex right scoliosis of the upper thoracic
spine is noted.
IMPRESSION: Recent postoperative changes of the anterior cervical discectomy
and fusion at C6-C7.  No acomplicating features.

## 2015-02-13 IMAGING — CT CT CERVICAL SPINE W/O CM
5 series · 16 of 33 positions shown, 18 images · non-contrast
Comparison: DG CERVICAL SPINE 2-3 VIEWS dated 11/14/2012; DG
CERVICAL SPINE 2-3 VIEWS dated 11/14/2012

CLINICAL DATA: Status post cervical fusion

EXAM:
CT CERVICAL SPINE WITHOUT CONTRAST
TECHNIQUE: Multidetector CT imaging of the cervical spine was performed without
intravenous contrast. Multiplanar CT image reconstructions were also
generated.

[Series 3: c spine bone · axial · 0.23mm/px · z∈[-150,-90]mm · 2 of 73 slices shown]
[im 25/73  bone]
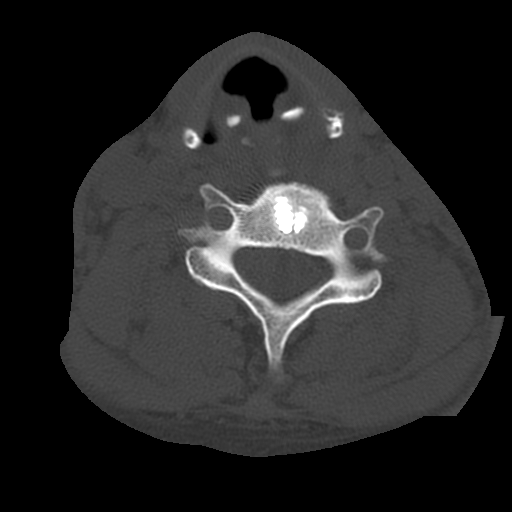
[im 49/73  bone]
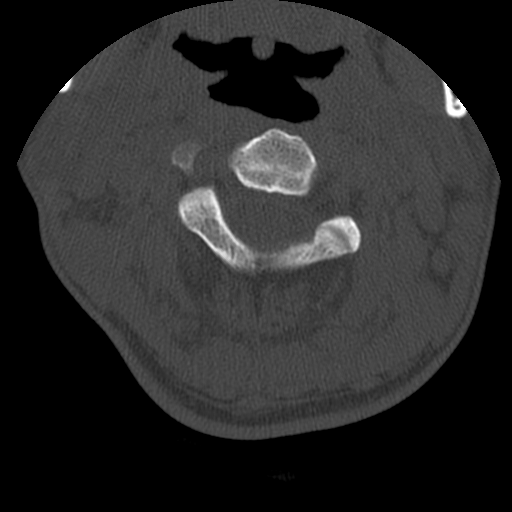

[Series 4: c spine soft · axial · 0.23mm/px · z∈[-150,-90]mm · 2 of 73 slices shown]
[im 25/73  soft-tissue]
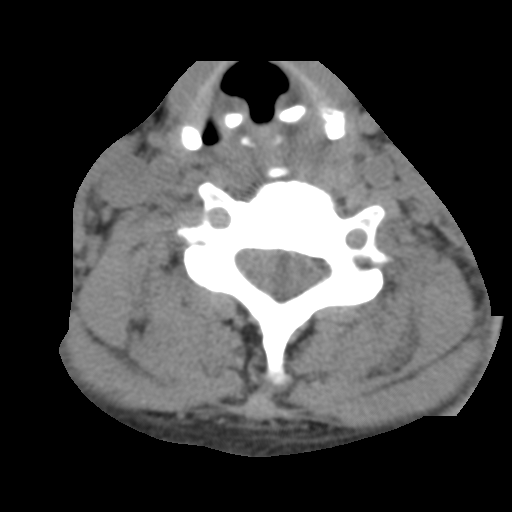
[im 49/73  soft-tissue]
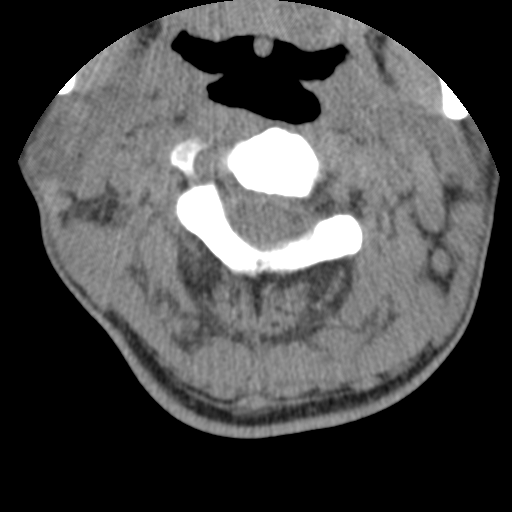

[Series 302: cor · coronal · 0.36mm/px · 3 of 42 slices shown]
[im 9/42  bone]
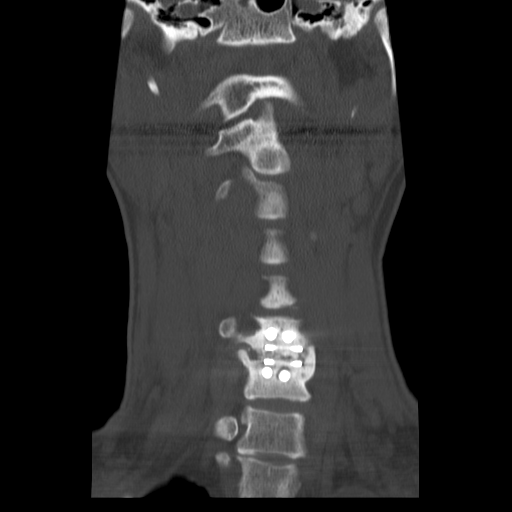
[im 17/42  bone]
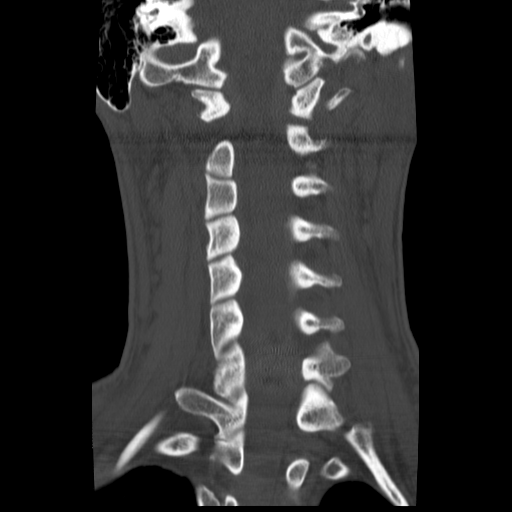
[im 25/42  bone]
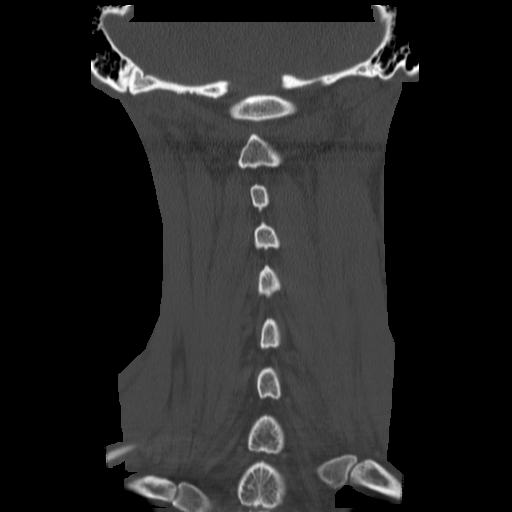

[Series 303: sag · sagittal · 0.36mm/px · 5 of 42 slices shown, 6 images]
[im 14/42  bone]
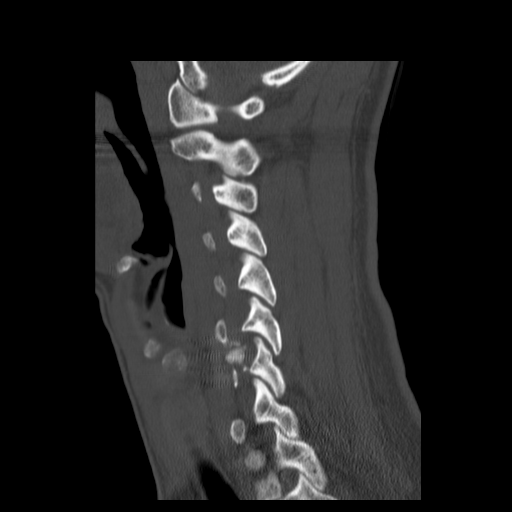
[im 18/42  bone]
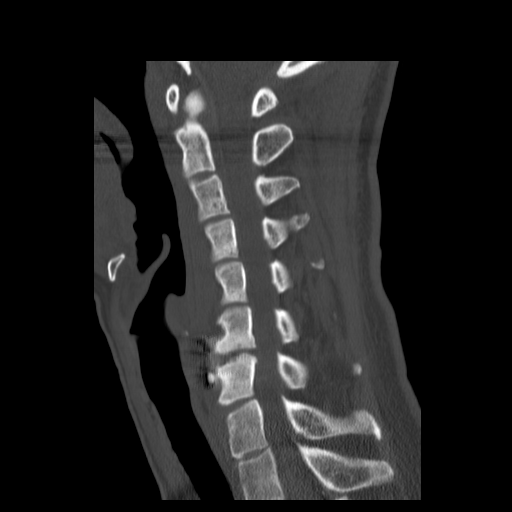
[im 21/42  soft-tissue]
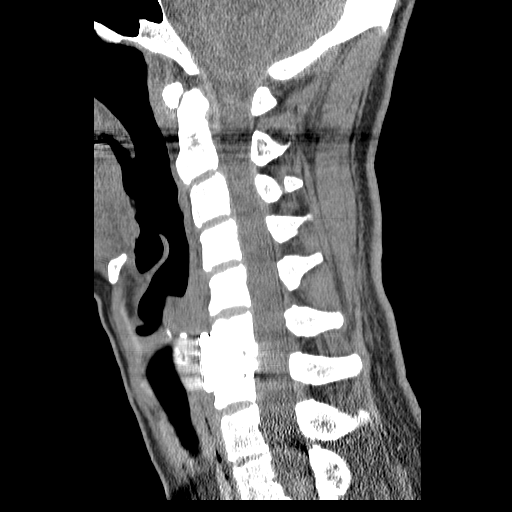
[im 21/42  bone]
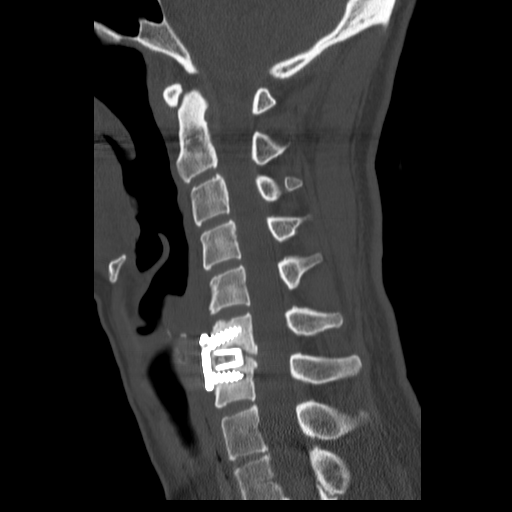
[im 24/42  bone]
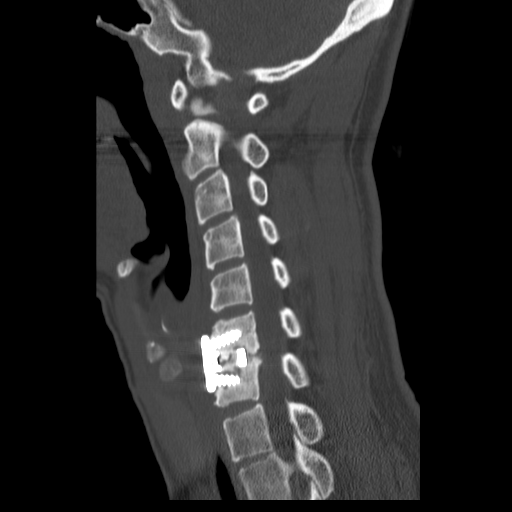
[im 28/42  bone]
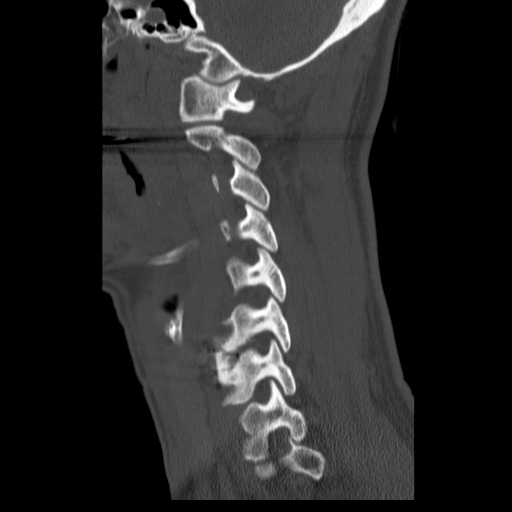

[Series 304: axial · axial · 0.23mm/px · z∈[-189,-77]mm · 4 of 96 slices shown, 5 images]
[im 20/96  soft-tissue]
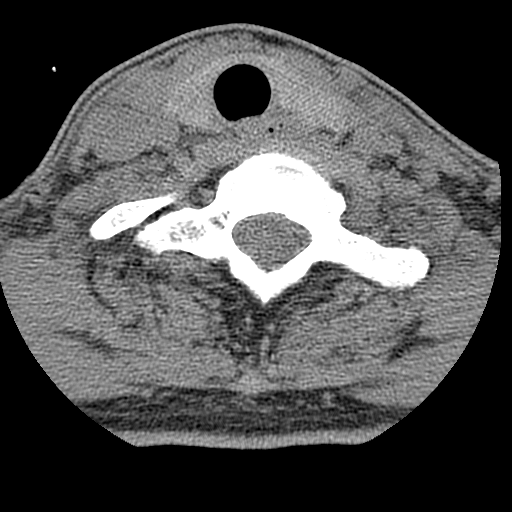
[im 20/96  bone]
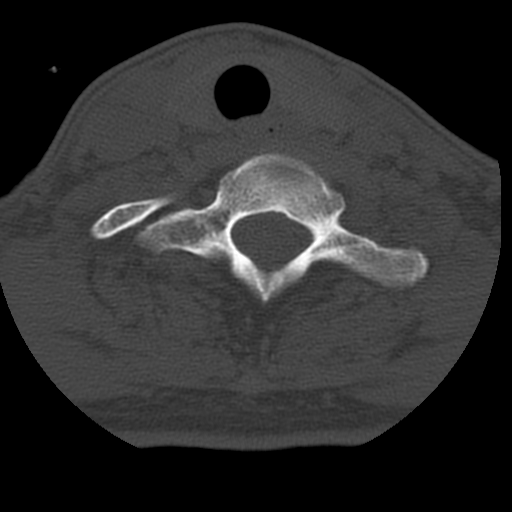
[im 39/96  bone]
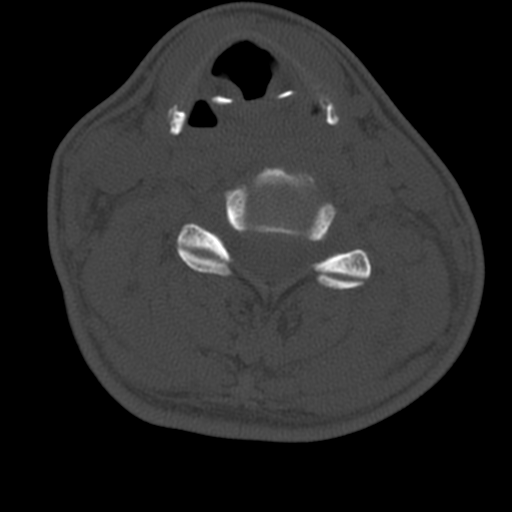
[im 58/96  bone]
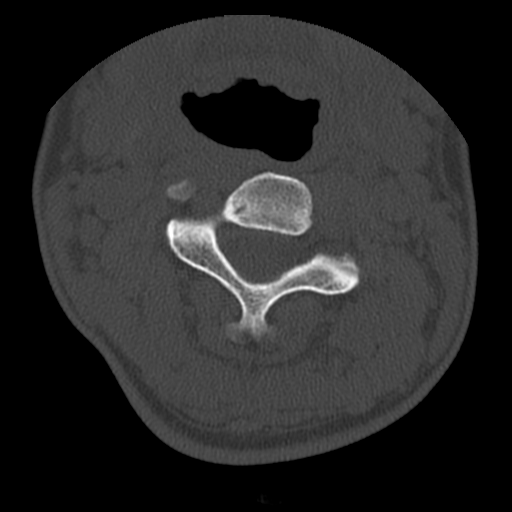
[im 77/96  bone]
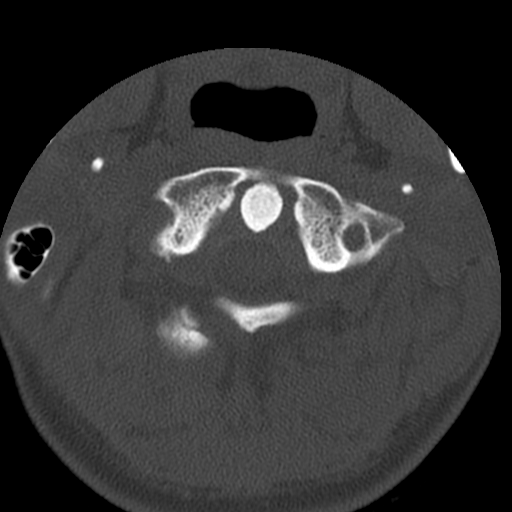

[16 of 33 positions shown; findings below may reference images not displayed]

FINDINGS: The alignment is anatomic. The vertebral body heights are
maintained. There is loss of the normal cervical lordosis with mild
reversal. There is no acute fracture. There is no static listhesis.
The prevertebral soft tissues are normal. The intraspinal soft
tissues are not fully imaged on this examination due to poor soft
tissue contrast, but there is no gross soft tissue abnormality.

Anterior cervical disc fusion at C6-C7 without hardware failure or
complication with an interbody cage device present. There is osseous
incorporation through the central portion of the disc space. The
remainder the disc spaces are preserved. There is mild bilateral
uncovertebral degenerative change at C6-7.

The visualized portions of the lung apices demonstrate no focal
abnormality.
IMPRESSION: Anterior cervical disc fusion at C6-7 without hardware failure or
complication with osseous incorporation through the central portion
of the disc space.
